# Patient Record
Sex: Male | Born: 1985 | Race: White | Hispanic: No | Marital: Married | Smoking: Former smoker
Health system: Southern US, Community
[De-identification: ages and names within clinical notes are randomized; demographics above are authoritative.]

## PROBLEM LIST (undated history)

## (undated) DIAGNOSIS — R7989 Other specified abnormal findings of blood chemistry: Secondary | ICD-10-CM

## (undated) DIAGNOSIS — T63001A Toxic effect of unspecified snake venom, accidental (unintentional), initial encounter: Secondary | ICD-10-CM

## (undated) DIAGNOSIS — G4730 Sleep apnea, unspecified: Secondary | ICD-10-CM

## (undated) HISTORY — PX: WISDOM TOOTH EXTRACTION: SHX21

## (undated) HISTORY — PX: VASECTOMY: SHX75

---

## 2012-06-16 ENCOUNTER — Other Ambulatory Visit: Payer: Self-pay | Admitting: Occupational Medicine

## 2012-06-16 ENCOUNTER — Ambulatory Visit
Admission: RE | Admit: 2012-06-16 | Discharge: 2012-06-16 | Disposition: A | Discharge status: Home / Self Care | Payer: No Typology Code available for payment source | Source: Ambulatory Visit | Attending: Occupational Medicine | Admitting: Occupational Medicine

## 2012-06-16 DIAGNOSIS — Z021 Encounter for pre-employment examination: Secondary | ICD-10-CM

## 2013-06-22 IMAGING — CR DG CHEST 1V
2 series · 2 of 2 positions shown · non-contrast
Comparison: None.

CLINICAL DATA: Smoking history

CHEST - 1 VIEW

[view not recorded (1 of 2)]
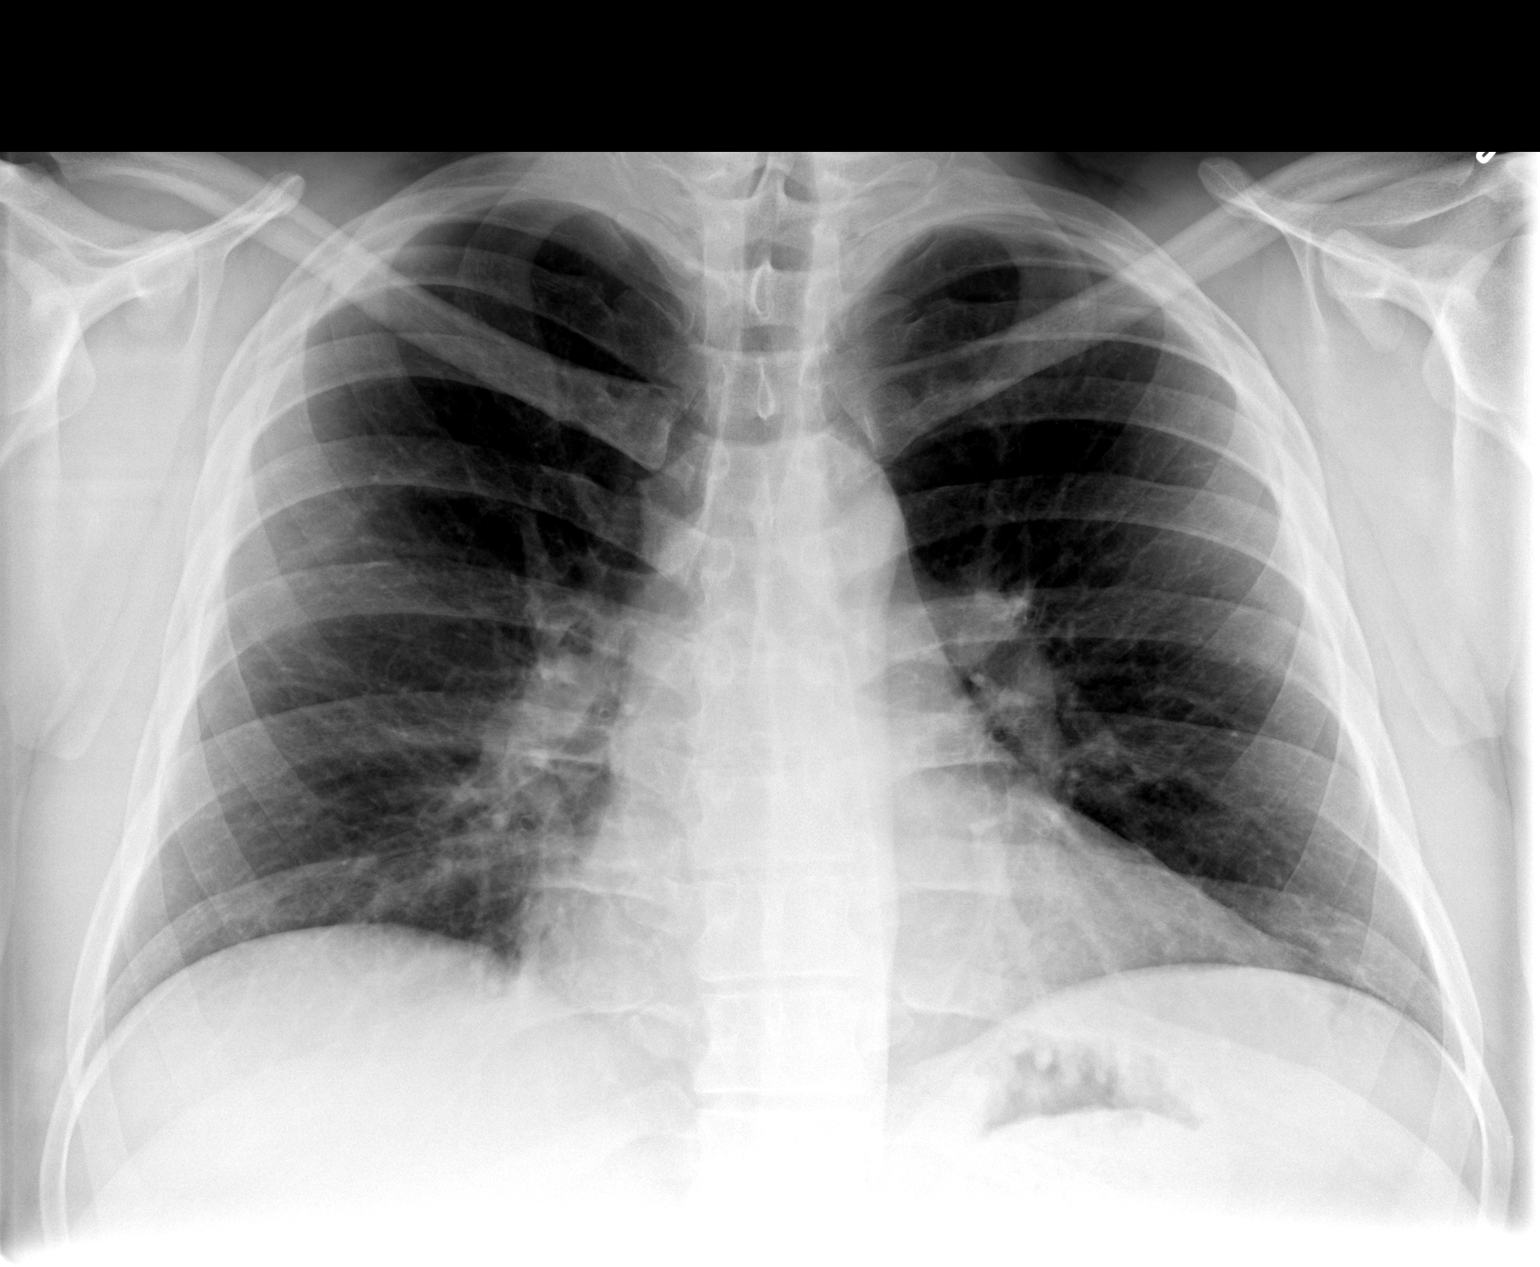

[view not recorded (2 of 2)]
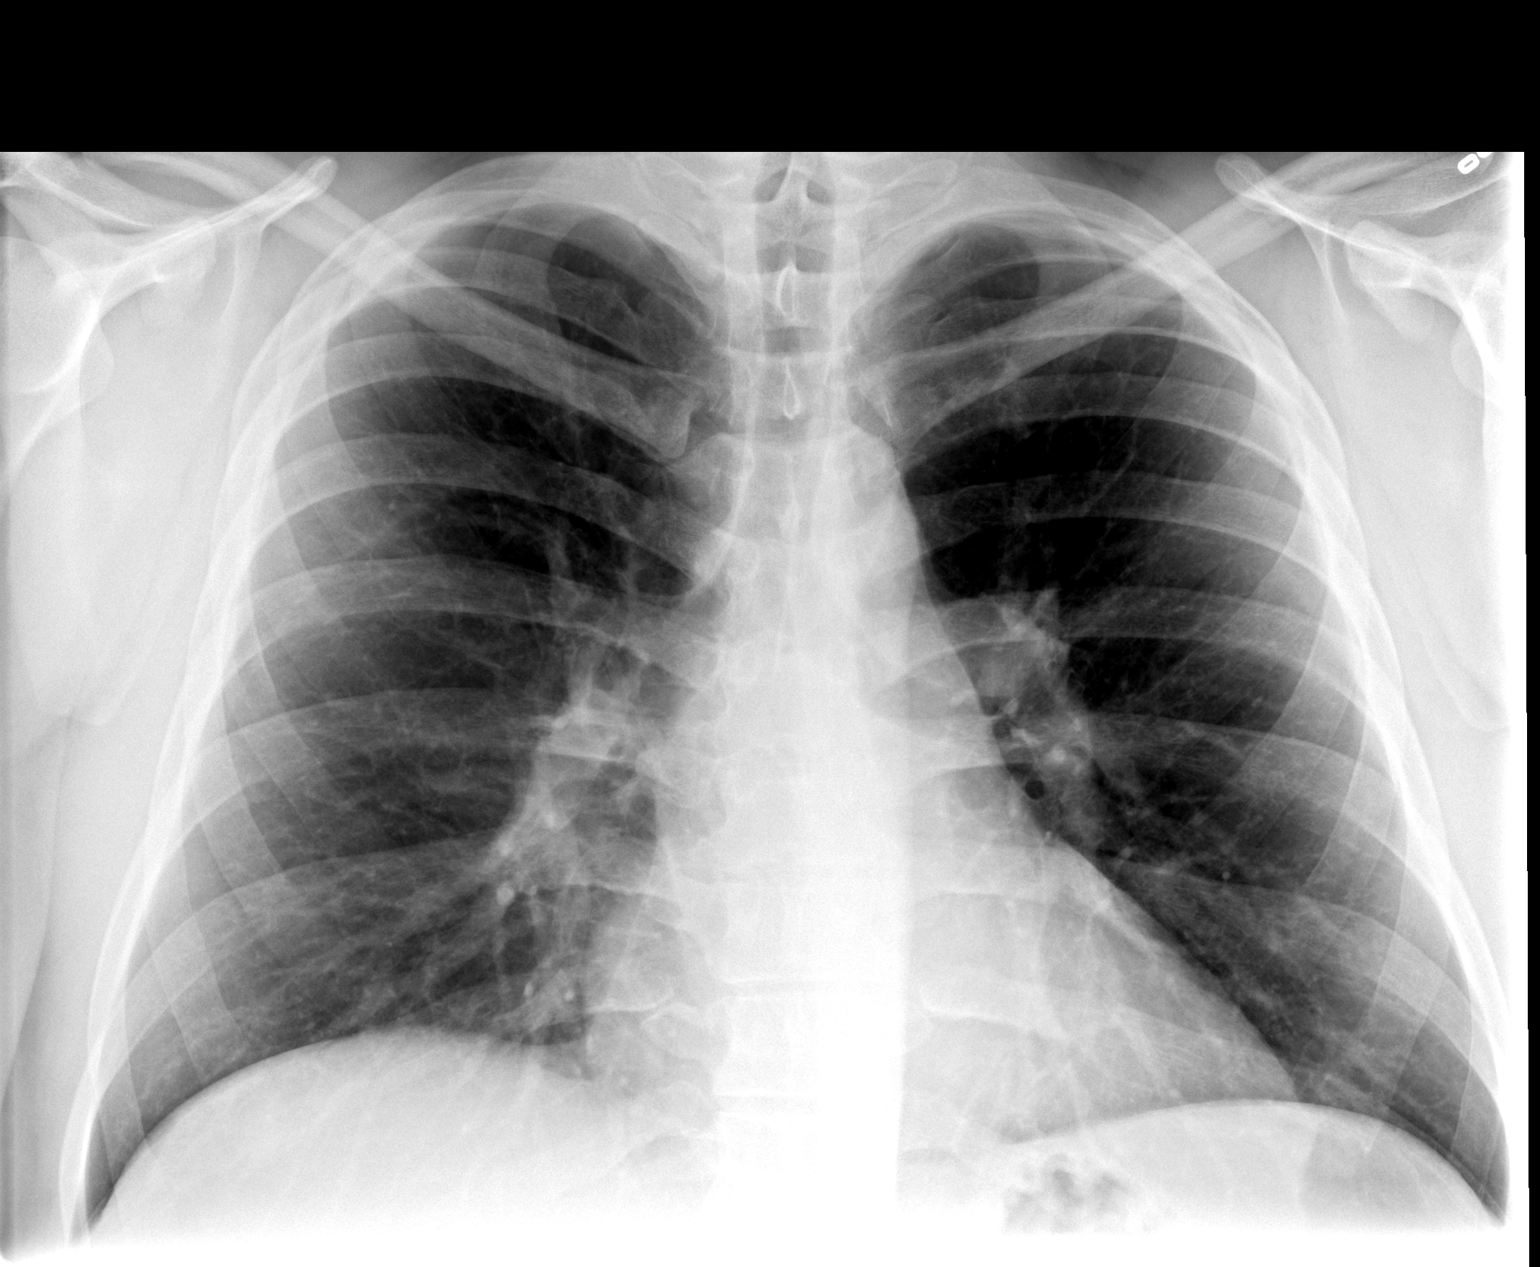

[2 of 2 positions shown; findings below may reference images not displayed]

FINDINGS: Heart size is normal.  Mediastinal shadows are normal.
The lungs are clear.  No effusions.  No bony abnormalities.
IMPRESSION: Normal one-view chest.

## 2016-10-08 ENCOUNTER — Inpatient Hospital Stay (HOSPITAL_COMMUNITY)
Admission: EM | Admit: 2016-10-08 | Discharge: 2016-10-10 | DRG: 918 | Disposition: A | Discharge status: Home / Self Care | Payer: 59 | Attending: Internal Medicine | Admitting: Internal Medicine

## 2016-10-08 ENCOUNTER — Encounter (HOSPITAL_COMMUNITY): Payer: Self-pay | Admitting: *Deleted

## 2016-10-08 DIAGNOSIS — T63001A Toxic effect of unspecified snake venom, accidental (unintentional), initial encounter: Secondary | ICD-10-CM | POA: Diagnosis not present

## 2016-10-08 DIAGNOSIS — Z91013 Allergy to seafood: Secondary | ICD-10-CM

## 2016-10-08 DIAGNOSIS — M25571 Pain in right ankle and joints of right foot: Secondary | ICD-10-CM | POA: Diagnosis not present

## 2016-10-08 DIAGNOSIS — X58XXXA Exposure to other specified factors, initial encounter: Secondary | ICD-10-CM | POA: Diagnosis present

## 2016-10-08 DIAGNOSIS — R000 Tachycardia, unspecified: Secondary | ICD-10-CM | POA: Diagnosis not present

## 2016-10-08 DIAGNOSIS — K269 Duodenal ulcer, unspecified as acute or chronic, without hemorrhage or perforation: Secondary | ICD-10-CM

## 2016-10-08 HISTORY — DX: Toxic effect of unspecified snake venom, accidental (unintentional), initial encounter: T63.001A

## 2016-10-08 HISTORY — DX: Sleep apnea, unspecified: G47.30

## 2016-10-08 HISTORY — DX: Other specified abnormal findings of blood chemistry: R79.89

## 2016-10-08 LAB — CBC WITH DIFFERENTIAL/PLATELET
Basophils Absolute: 0 10*3/uL (ref 0.0–0.1)
Basophils Relative: 0 %
Eosinophils Absolute: 0.2 10*3/uL (ref 0.0–0.7)
Eosinophils Relative: 2 %
HCT: 49.7 % (ref 39.0–52.0)
Hemoglobin: 16.7 g/dL (ref 13.0–17.0)
Lymphocytes Relative: 41 %
Lymphs Abs: 3.2 10*3/uL (ref 0.7–4.0)
MCH: 27.8 pg (ref 26.0–34.0)
MCHC: 33.6 g/dL (ref 30.0–36.0)
MCV: 82.7 fL (ref 78.0–100.0)
Monocytes Absolute: 0.6 10*3/uL (ref 0.1–1.0)
Monocytes Relative: 8 %
Neutro Abs: 3.8 10*3/uL (ref 1.7–7.7)
Neutrophils Relative %: 49 %
Platelets: 311 10*3/uL (ref 150–400)
RBC: 6.01 MIL/uL — ABNORMAL HIGH (ref 4.22–5.81)
RDW: 13.8 % (ref 11.5–15.5)
WBC: 7.7 10*3/uL (ref 4.0–10.5)

## 2016-10-08 LAB — BASIC METABOLIC PANEL
Anion gap: 12 (ref 5–15)
BUN: 12 mg/dL (ref 6–20)
CO2: 23 mmol/L (ref 22–32)
Calcium: 9.8 mg/dL (ref 8.9–10.3)
Chloride: 104 mmol/L (ref 101–111)
Creatinine, Ser: 0.95 mg/dL (ref 0.61–1.24)
GFR calc Af Amer: >60 mL/min (ref >60)
GFR calc non Af Amer: >60 mL/min (ref >60)
Glucose, Bld: 90 mg/dL (ref 65–99)
Potassium: 3.8 mmol/L (ref 3.5–5.1)
Sodium: 139 mmol/L (ref 135–145)

## 2016-10-08 LAB — CBC
HEMATOCRIT: 46.1 % (ref 39.0–52.0)
HEMATOCRIT: 44.8 % (ref 39.0–52.0)
HEMOGLOBIN: 15.4 g/dL (ref 13.0–17.0)
HEMOGLOBIN: 14.9 g/dL (ref 13.0–17.0)
MCH: 27.6 pg (ref 26.0–34.0)
MCH: 27.5 pg (ref 26.0–34.0)
MCHC: 33.4 g/dL (ref 30.0–36.0)
MCHC: 33.3 g/dL (ref 30.0–36.0)
MCV: 82.8 fL (ref 78.0–100.0)
MCV: 82.7 fL (ref 78.0–100.0)
Platelets: 272 10*3/uL (ref 150–400)
Platelets: 286 10*3/uL (ref 150–400)
RBC: 5.57 MIL/uL (ref 4.22–5.81)
RBC: 5.42 MIL/uL (ref 4.22–5.81)
RDW: 13.7 % (ref 11.5–15.5)
RDW: 13.8 % (ref 11.5–15.5)
WBC: 9.2 10*3/uL (ref 4.0–10.5)
WBC: 8.7 10*3/uL (ref 4.0–10.5)

## 2016-10-08 LAB — PROTIME-INR
INR: 1.04
INR: 1.08
INR: 1.11
Prothrombin Time: 13.6 seconds (ref 11.4–15.2)
Prothrombin Time: 14.1 seconds (ref 11.4–15.2)
Prothrombin Time: 14.4 seconds (ref 11.4–15.2)

## 2016-10-08 LAB — FIBRINOGEN
Fibrinogen: 378 mg/dL (ref 210–475)
Fibrinogen: 300 mg/dL (ref 210–475)
Fibrinogen: 285 mg/dL (ref 210–475)

## 2016-10-08 LAB — CK
CK TOTAL: 84 U/L (ref 49–397)
CK TOTAL: 79 U/L (ref 49–397)

## 2016-10-08 MED ORDER — HYDROCODONE-ACETAMINOPHEN 5-325 MG PO TABS
1.0000 | ORAL_TABLET | ORAL | Status: DC | PRN
  Administered 2016-10-08 (×2): 2 via ORAL
  Administered 2016-10-09 (×2): 1 via ORAL
  Administered 2016-10-09: 2 via ORAL
  Filled 2016-10-08 (×4): qty 2
  Filled 2016-10-08: qty 1

## 2016-10-08 MED ORDER — SODIUM CHLORIDE 0.9 % IV SOLN
INTRAVENOUS | Status: DC
  Administered 2016-10-09: 19:00:00 via INTRAVENOUS
  Administered 2016-10-09: 1000 mL via INTRAVENOUS
  Administered 2016-10-10: 03:00:00 via INTRAVENOUS

## 2016-10-08 MED ORDER — ONDANSETRON HCL 4 MG PO TABS: 4.0000 mg | ORAL_TABLET | Freq: Four times a day (QID) | ORAL | Status: DC | PRN

## 2016-10-08 MED ORDER — CROTALIDAE POLYVAL IMMUNE FAB IV SOLR
1.0000 | Freq: Once | INTRAVENOUS | Status: AC
  Administered 2016-10-08: 18 mL via INTRAVENOUS
  Filled 2016-10-08: qty 18

## 2016-10-08 MED ORDER — MORPHINE SULFATE (PF) 2 MG/ML IV SOLN
2.0000 mg | INTRAVENOUS | Status: DC | PRN
  Administered 2016-10-08 – 2016-10-09 (×2): 2 mg via INTRAVENOUS
  Filled 2016-10-08 (×2): qty 1

## 2016-10-08 MED ORDER — ACETAMINOPHEN 325 MG PO TABS
650.0000 mg | ORAL_TABLET | Freq: Four times a day (QID) | ORAL | Status: DC | PRN
  Administered 2016-10-08 – 2016-10-10 (×2): 650 mg via ORAL
  Filled 2016-10-08 (×2): qty 2

## 2016-10-08 MED ORDER — SODIUM CHLORIDE 0.9 % IV SOLN
3.0000 | Freq: Once | INTRAVENOUS | Status: AC
  Administered 2016-10-08: 54 mL via INTRAVENOUS
  Filled 2016-10-08: qty 54

## 2016-10-08 MED ORDER — SODIUM CHLORIDE 0.9 % IV BOLUS (SEPSIS)
1000.0000 mL | Freq: Once | INTRAVENOUS | Status: AC
  Administered 2016-10-08: 1000 mL via INTRAVENOUS

## 2016-10-08 MED ORDER — SODIUM CHLORIDE 0.9% FLUSH
3.0000 mL | Freq: Two times a day (BID) | INTRAVENOUS | Status: DC
  Administered 2016-10-08 – 2016-10-09 (×3): 3 mL via INTRAVENOUS

## 2016-10-08 MED ORDER — SODIUM CHLORIDE 0.9 % IV SOLN
4.0000 | Freq: Once | INTRAVENOUS | Status: DC
  Filled 2016-10-08: qty 72

## 2016-10-08 MED ORDER — ACETAMINOPHEN 650 MG RE SUPP: 650.0000 mg | Freq: Four times a day (QID) | RECTAL | Status: DC | PRN

## 2016-10-08 MED ORDER — MORPHINE SULFATE (PF) 4 MG/ML IV SOLN
4.0000 mg | Freq: Once | INTRAVENOUS | Status: AC
  Administered 2016-10-08: 4 mg via INTRAVENOUS
  Filled 2016-10-08: qty 1

## 2016-10-08 MED ORDER — ENOXAPARIN SODIUM 40 MG/0.4ML ~~LOC~~ SOLN
40.0000 mg | SUBCUTANEOUS | Status: DC
  Filled 2016-10-08: qty 0.4

## 2016-10-08 MED ORDER — HYDROMORPHONE HCL 1 MG/ML IJ SOLN
1.0000 mg | Freq: Once | INTRAMUSCULAR | Status: AC
  Administered 2016-10-08: 1 mg via INTRAVENOUS
  Filled 2016-10-08: qty 1

## 2016-10-08 MED ORDER — ONDANSETRON HCL 4 MG/2ML IJ SOLN: 4.0000 mg | Freq: Four times a day (QID) | INTRAMUSCULAR | Status: DC | PRN

## 2016-10-08 NOTE — ED Triage Notes (Signed)
Pt is here with snake bite (copperhead) right inner ankle.

## 2016-10-08 NOTE — H&P (Signed)
History and Physical    Brent Arroyo:[id number] DOB: 1986-04-24 DOA: 10/08/2016  [facility]: [facility] Patient coming from: home  Chief Complaint: snake bite  HPI: Brent Arroyo is a 30 y.o. male with medical history significant low testosterone presents to the emergency department chief complaint of snake bite.  Information is obtained from the patient. He reports he was cleaning out his shed he felt pain in his right ankle that he equated to "felt yellow jacket sting". He looked down and saw 2 small dark spots with blood coming out of one of them. He looked around the shed and saw snake. He reports killing the snake and believes it was a copperhead. He came straight to the hospital. He reports increasing pain to his ankle as well as swelling and heat. Pain is constant describes it as an ache and radiates up his leg. Associated symptoms include some numbness at the wound site. He denies headache visual changes chest pain palpitation shortness of breath. He denies any abdominal pain nausea vomiting. He denies any fever chills.    ED Course: In emergency department he's afebrile hemodynamically stable but a little tachycardic and not hypoxic. He is provided with IV fluids.  Review of Systems: As per HPI otherwise 10 point review of systems negative.   Ambulatory Status: Ambulates independently at the steady gait  Past Medical History:  Diagnosis Date  . Low testosterone in male   . Snake bite poisoning     History reviewed. No pertinent surgical history.   Social History   Social History  . Marital status: Married    Spouse name: N/A  . Number of children: N/A  . Years of education: N/A   Occupational History  . Not on file.   Social History Main Topics  . Smoking status: Former Games developer  . Smokeless tobacco: Never Used  . Alcohol use Yes     Comment: occ  . Drug use: No  . Sexual activity: Not on file   Other Topics Concern  . Not on file    Social History Narrative  . No narrative on file   Patient lives at home with his wife. 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 Shellfish Allergy Diarrhea and Nausea And Vomiting    Family History  Problem Relation Age of Onset  . Breast cancer Mother   . CAD Father    Other is alive with breast cancer  Prior to Admission medications   Medication Sig Start Date End Date Taking? Authorizing Provider  ANDROGEL PUMP 20.25 MG/ACT (1.62%) GEL Apply 2 Squirts topically daily. 09/11/16  Yes Historical Provider, MD    Physical Exam: Vitals:   10/08/16 1415 10/08/16 1430 10/08/16 1445 10/08/16 1500  BP: 128/82 136/91 128/80 134/88  Pulse: 81 85 79 80  Resp: 16 13 26 21   Temp:      TempSrc:      SpO2: 97% 97% 95% 96%  Weight:      Height:         General:  Appears calm and comfortable, not flushed Eyes:  PERRL, EOMI, normal lids, iris ENT:  grossly normal hearing, lips & tongue, his membranes of his mouth are pink slightly dry Neck:  no LAD, masses or thyromegaly Cardiovascular:  RRR, no m/r/g. Mild edema on right ankle Respiratory:  CTA bilaterally, no w/r/r. Normal respiratory effort. Abdomen:  soft, ntnd, obese positive bowel sounds no guarding or rebounding  Skin:  no rash or induration seen on limited exam 2 punctate marks medial aspect right ankle. Very little erythema some heat tenderness Musculoskeletal:  grossly normal tone BUE/BLE, good ROM, no bony abnormality Psychiatric:  grossly normal mood and affect, speech fluent and appropriate, AOx3 Neurologic:  CN 2-12 grossly intact, moves all extremities in coordinated fashion, sensation intact  Labs on Admission: I have personally reviewed following labs and imaging studies  CBC:  Recent Labs Lab 10/08/16 1210  WBC 7.7  NEUTROABS 3.8  HGB 16.7  HCT 49.7  MCV 82.7  PLT 311   Basic Metabolic Panel:  Recent Labs Lab 10/08/16 1210  NA 139  K 3.8  CL  104  CO2 23  GLUCOSE 90  BUN 12  CREATININE 0.95  CALCIUM 9.8   GFR: Estimated Creatinine Clearance: 173.5 mL/min (by C-G formula based on SCr of 0.95 mg/dL). Liver Function Tests: No results for input(s): AST, ALT, ALKPHOS, BILITOT, PROT, ALBUMIN in the last 168 hours. No results for input(s): LIPASE, AMYLASE in the last 168 hours. No results for input(s): AMMONIA in the last 168 hours. Coagulation Profile:  Recent Labs Lab 10/08/16 1210  INR 1.04   Cardiac Enzymes: No results for input(s): CKTOTAL, CKMB, CKMBINDEX, TROPONINI in the last 168 hours. BNP (last 3 results) No results for input(s): PROBNP in the last 8760 hours. HbA1C: No results for input(s): HGBA1C in the last 72 hours. CBG: No results for input(s): GLUCAP in the last 168 hours. Lipid Profile: No results for input(s): CHOL, HDL, LDLCALC, TRIG, CHOLHDL, LDLDIRECT in the last 72 hours. Thyroid Function Tests: No results for input(s): TSH, T4TOTAL, FREET4, T3FREE, THYROIDAB in the last 72 hours. Anemia Panel: No results for input(s): VITAMINB12, FOLATE, FERRITIN, TIBC, IRON, RETICCTPCT in the last 72 hours. Urine analysis: No results found for: COLORURINE, APPEARANCEUR, LABSPEC, PHURINE, GLUCOSEU, HGBUR, BILIRUBINUR, KETONESUR, PROTEINUR, UROBILINOGEN, NITRITE, LEUKOCYTESUR  Creatinine Clearance: Estimated Creatinine Clearance: 173.5 mL/min (by C-G formula based on SCr of 0.95 mg/dL).  Sepsis Labs: @LABRCNTIP (procalcitonin:4,lacticidven:4) )No results found for this or any previous visit (from the past 240 hour(s)).   Radiological Exams on Admission: No results found.  EKG:   Assessment/Plan Principal Problem:   Snake bite poisoning   1. Snake bite poisoning. Patient with 2 punctate marks on his right ankle. He reports copperhead. Initial CBC, INR and fibrinogen within normal limits of normal -Admit to telemetry -CBC INR, CK, fibrinogen every 43 -[facility] -Pain [facility]  #2. Tachycardia. Likely related to above. He is provided with IV fluids. Resolved on admission -gentle IV fluids     DVT prophylaxis: lovenox  Code Status: full  Family Communication: wife and mother at bedside  Disposition Plan: home hopefully in am  Consults called: none  Admission status: obs    Gwenyth BenderBLACK,Yesika Rispoli M MD [facility]  If 7PM-7AM, please contact night-coverage [email or web address] Password [email or web address]  10/08/2016, 3:22 PM

## 2016-10-08 NOTE — ED Notes (Signed)
Pharmacy states they will send 1 vial Crofab to [facility] when ready.

## 2016-10-08 NOTE — ED Provider Notes (Signed)
[facility]-EMERGENCY DEPT Provider Note   CSN: [id number] Arrival date & time: 10/08/16  1149     History   Chief Complaint Chief Complaint  Patient presents with  . Snake Bite    HPI Brent Arroyo is a 30 y.o. male who presents with snake bite to right medial ankle that occurred just prior to arrival. Patient states he was cleaning out a shed at work and a snake came out and that in the ankle. Patient killed the snake and is pretty sure that it is a copperhead. Patient is having increasing pain to his ankle that is radiating up his leg. Patient has localized numbness around the wound. He describes a generalized tightness in his muscles, but denies any difficulty breathing. Patient denies any chest pain, shortness of breath, abdominal pain, nausea, vomiting. He did not take any medications PTA. Tetanus up-to-date.  HPI  Past Medical History:  Diagnosis Date  . Low testosterone in male   . Snake bite poisoning     Patient Active Problem List   Diagnosis Date Noted  . Snake bite poisoning 10/08/2016    History reviewed. No pertinent surgical history.     Home Medications    Prior to Admission medications   Medication Sig Start Date End Date Taking? Authorizing Provider  ANDROGEL PUMP 20.25 MG/ACT (1.62%) GEL Apply 2 Squirts topically daily. 09/11/16  Yes Historical Provider, MD    Family History Family History  Problem Relation Age of Onset  . Breast cancer Mother   . CAD Father     Social History Social History  Substance Use Topics  . Smoking status: Former Games developer  . Smokeless tobacco: Never Used  . Alcohol use Yes     Comment: occ     Allergies   Shellfish allergy   Review of Systems Review of Systems  Constitutional: Negative for chills and fever.  HENT: Negative for facial swelling and sore throat.   Respiratory: Negative for shortness of breath.   Cardiovascular: Negative for chest pain.  Gastrointestinal: Negative for abdominal pain, nausea  and vomiting.  Genitourinary: Negative for dysuria.  Musculoskeletal: Negative for back pain.  Skin: Positive for wound. Negative for rash.  Neurological: Positive for numbness (surrounding wound). Negative for headaches.  Psychiatric/Behavioral: The patient is not nervous/anxious.      Physical Exam Updated Vital Signs BP 134/88   Pulse 80   Temp 98 F (36.7 C) (Oral)   Resp 21   Ht 6\' 4"  (1.93 m)   Wt (!) 142 kg   SpO2 96%   BMI 38.10 kg/m   Physical Exam  Constitutional: He appears well-developed and well-nourished. No distress.  HENT:  Head: Normocephalic and atraumatic.  Mouth/Throat: Oropharynx is clear and moist. No oropharyngeal exudate.  Eyes: Conjunctivae are normal. Pupils are equal, round, and reactive to light. Right eye exhibits no discharge. Left eye exhibits no discharge. No scleral icterus.  Neck: Normal range of motion. Neck supple. No thyromegaly present.  Cardiovascular: Normal rate, regular rhythm, normal heart sounds and intact distal pulses.  Exam reveals no gallop and no friction rub.   No murmur heard. Pulmonary/Chest: Effort normal and breath sounds normal. No stridor. No respiratory distress. He has no wheezes. He has no rales.  Abdominal: Soft. Bowel sounds are normal. He exhibits no distension. There is no tenderness. There is no rebound and no guarding.  Musculoskeletal: He exhibits no edema.       Right lower leg: He exhibits tenderness.  Legs: Lymphadenopathy:    He has no cervical adenopathy.  Neurological: He is alert. Coordination normal.  Normal sensation to bilateral LEs with exception of 5 cm area surrounding wound; numbness to said area  Skin: Skin is warm and dry. No rash noted. He is not diaphoretic. No pallor.  2 punctate marks with dried blood over medial malleous of R ankle; minimal swelling, no erythema; see image  Psychiatric: He has a normal mood and affect.  Nursing note and vitals reviewed.      ED Treatments /  Results  Labs (all labs ordered are listed, but only abnormal results are displayed) Labs Reviewed  CBC WITH DIFFERENTIAL/PLATELET - Abnormal; Notable for the following:       Result Value   RBC 6.01 (*)    All other components within normal limits  PROTIME-INR  FIBRINOGEN  BASIC METABOLIC PANEL  CBC  PROTIME-INR  FIBRINOGEN  CBC  PROTIME-INR  FIBRINOGEN    EKG  EKG Interpretation None       Radiology No results found.  Procedures Procedures (including critical care time)  Medications Ordered in ED Medications  sodium chloride 0.9 % bolus 1,000 mL (1,000 mLs Intravenous New Bag/Given 10/08/16 1215)    And  0.9 %  sodium chloride infusion ( Intravenous Hold 10/08/16 1216)  crotalidae polyvalent immune fab (CROFAB) 3 vial in sodium chloride 0.9 % 304 mL infusion (54 mLs Intravenous Given 10/08/16 1443)  crotalidae polyvalent immune fab (CROFAB) 1 vial in sodium chloride 0.9 % 268 mL infusion (not administered)  enoxaparin (LOVENOX) injection 40 mg (not administered)  sodium chloride flush (NS) 0.9 % injection 3 mL (not administered)  acetaminophen (TYLENOL) tablet 650 mg (not administered)    Or  acetaminophen (TYLENOL) suppository 650 mg (not administered)  HYDROcodone-acetaminophen (NORCO/VICODIN) 5-325 MG per tablet 1-2 tablet (2 tablets Oral Given 10/08/16 1513)  ondansetron (ZOFRAN) tablet 4 mg (not administered)    Or  ondansetron (ZOFRAN) injection 4 mg (not administered)  morphine 4 MG/ML injection 4 mg (4 mg Intravenous Given 10/08/16 1239)  HYDROmorphone (DILAUDID) injection 1 mg (1 mg Intravenous Given 10/08/16 1345)     Initial Impression / Assessment and Plan / ED Course  I have reviewed the triage vital signs and the nursing notes.  Pertinent labs & imaging results that were available during my care of the patient were reviewed by me and considered in my medical decision making (see chart for details).  Clinical Course    Patient meeting CroFab  criteria due to spreading point in pain as well as tachycardia at onset. Initial labs unremarkable, however patient to be admitted for repeat labs every 4 hours for 12-18 hours as recommended by Hospital algorithm for snake bite. I spoke with Brent Arroyo with [facility] who is that the patient to observation. Patient also evaluated by Dr. Denton LankSteinl who guided the patient's management and agrees with plan.  Final Clinical Impressions(s) / ED Diagnoses   Final diagnoses:  Toxic effect of snake venom, unintentional, initial encounter    New Prescriptions New Prescriptions   No medications on file     Brent Holeslexandra M Summerlynn Glauser, PA-C 10/08/16 1526    Cathren LaineKevin Steinl, MD 10/08/16 1531

## 2016-10-09 ENCOUNTER — Encounter (HOSPITAL_COMMUNITY): Payer: Self-pay | Admitting: General Practice

## 2016-10-09 DIAGNOSIS — Z91013 Allergy to seafood: Secondary | ICD-10-CM | POA: Diagnosis not present

## 2016-10-09 DIAGNOSIS — X58XXXA Exposure to other specified factors, initial encounter: Secondary | ICD-10-CM | POA: Diagnosis present

## 2016-10-09 DIAGNOSIS — M25571 Pain in right ankle and joints of right foot: Secondary | ICD-10-CM | POA: Diagnosis present

## 2016-10-09 DIAGNOSIS — T63001A Toxic effect of unspecified snake venom, accidental (unintentional), initial encounter: Secondary | ICD-10-CM | POA: Diagnosis not present

## 2016-10-09 DIAGNOSIS — R000 Tachycardia, unspecified: Secondary | ICD-10-CM | POA: Diagnosis not present

## 2016-10-09 LAB — BASIC METABOLIC PANEL
Anion gap: 5 (ref 5–15)
BUN: 12 mg/dL (ref 6–20)
CO2: 24 mmol/L (ref 22–32)
Calcium: 8.7 mg/dL — ABNORMAL LOW (ref 8.9–10.3)
Chloride: 109 mmol/L (ref 101–111)
Creatinine, Ser: 0.89 mg/dL (ref 0.61–1.24)
GFR calc Af Amer: >60 mL/min (ref >60)
GLUCOSE: 110 mg/dL — AB (ref 65–99)
POTASSIUM: 4 mmol/L (ref 3.5–5.1)
Sodium: 138 mmol/L (ref 135–145)

## 2016-10-09 LAB — CBC
HEMATOCRIT: 44.8 % (ref 39.0–52.0)
Hemoglobin: 14.8 g/dL (ref 13.0–17.0)
MCH: 27.5 pg (ref 26.0–34.0)
MCHC: 33 g/dL (ref 30.0–36.0)
MCV: 83.3 fL (ref 78.0–100.0)
Platelets: 271 10*3/uL (ref 150–400)
RBC: 5.38 MIL/uL (ref 4.22–5.81)
RDW: 13.9 % (ref 11.5–15.5)
WBC: 7.8 10*3/uL (ref 4.0–10.5)

## 2016-10-09 LAB — PROTIME-INR
INR: 1.1
Prothrombin Time: 14.2 seconds (ref 11.4–15.2)

## 2016-10-09 MED ORDER — SODIUM CHLORIDE 0.9 % IV SOLN
2.0000 | Freq: Once | INTRAVENOUS | Status: AC
  Administered 2016-10-09: 36 mL via INTRAVENOUS
  Filled 2016-10-09: qty 36

## 2016-10-09 MED ORDER — MORPHINE SULFATE (PF) 2 MG/ML IV SOLN
2.0000 mg | INTRAVENOUS | Status: DC | PRN
  Administered 2016-10-09 (×3): 2 mg via INTRAVENOUS
  Filled 2016-10-09 (×3): qty 1

## 2016-10-09 NOTE — Progress Notes (Signed)
PROGRESS NOTE    Brent Arroyo  ZOX:[id number] DOB: 08/03/1986 DOA: 10/08/2016 [facility]     Brief Narrative:  Brent Arroyo is a 30 y.o. male with medical history significant low testosterone presents to the emergency department chief complaint of snake bite. He reports he was cleaning out his shed he felt pain in his right ankle that he equated to "felt yellow jacket sting". He looked down and saw 2 small dark spots with blood coming out of one of them. He looked around the shed and saw snake. He reports killing the snake and believes it was a copperhead. He came straight to the hospital. He reports increasing pain to his ankle as well as swelling and heat. Pain is constant describes it as an ache and radiates up his leg. Associated symptoms include some numbness at the wound site. He denies headache visual changes chest pain palpitation shortness of breath. He denies any abdominal pain nausea vomiting. He denies any fever chills.   Assessment & Plan:   Principal Problem:   Snake bite poisoning Active Problems:   Tachycardia    Snake bite poisoning -Patient with 2 punctate marks on his right ankle. He reports copperhead snake.  -CroFab started in ED per protocol (received total 4 vials so far) -Labs stable -Called and spoke with Poison Control this morning. Discussed his progression of symptoms. Will administer 2 more vials today. Continue to elevate leg above level of heart. Ambulate only as tolerated. Opioid pain control, goal is to control pain with oral agents prior to discharge. Observe 1 more day. They will follow up tonight.   Tachycardia -Resolved    DVT prophylaxis: lovenox Code Status: full Family Communication: wife at bedside Disposition Plan: pending further improvement. Home when stable.    Consultants:   None  Procedures:   None  Antimicrobials:   None     Subjective: Patient complains of worsening swelling today.  Swelling has now progressed up to his calf and down to his foot. His pain is improved when laying in bed, but can hardly put any pressure on his foot to ambulate. No fever, chills, no bleeding at the site, no sensation loss, no chest pain or SOB, no N/V/D.   Objective: Vitals:   10/08/16 1710 10/08/16 2009 10/09/16 0454 10/09/16 0954  BP: 121/67 132/73 118/66 105/72  Pulse: 77 82 68 90  Resp: 18 20 20 19   Temp: 97.6 F (36.4 C) 97.7 F (36.5 C) 97.7 F (36.5 C) 97.8 F (36.6 C)  TempSrc: Oral   Oral  SpO2: 98% 98% 97% 94%  Weight: (!) 142 kg (313 lb) (!) 147.4 kg (325 lb)    Height: 6\' 4"  (1.93 m)       Intake/Output Summary (Last 24 hours) at 10/09/16 1009 Last data filed at 10/09/16 0455  Gross per 24 hour  Intake              378 ml  Output             1100 ml  Net             -722 ml   Filed Weights   10/08/16 1159 10/08/16 1710 10/08/16 2009  Weight: (!) 142 kg (313 lb) (!) 142 kg (313 lb) (!) 147.4 kg (325 lb)    Examination:  General exam: Appears calm and comfortable  Respiratory system: Clear to auscultation. Respiratory effort normal. Cardiovascular system: S1 & S2 heard, RRR. No JVD, murmurs, rubs,  gallops or clicks.  Gastrointestinal system: Abdomen is nondistended, soft and nontender. No organomegaly or masses felt. Normal bowel sounds heard. Central nervous system: Alert and oriented. No focal neurological deficits. Extremities: Symmetric 5 x 5 power. Skin: +right medial ankle with swelling, no bleeding at puncture site, +2 edema right lower extremity, +2 pedal pulses  Psychiatry: Judgement and insight appear normal. Mood & affect appropriate.   Data Reviewed: I have personally reviewed following labs and imaging studies  CBC:  Recent Labs Lab 10/08/16 1210 10/08/16 1802 10/08/16 2150 10/09/16 0425  WBC 7.7 9.2 8.7 7.8  NEUTROABS 3.8  --   --   --   HGB 16.7 15.4 14.9 14.8  HCT 49.7 46.1 44.8 44.8  MCV 82.7 82.8 82.7 83.3  PLT 311 272 286 271    Basic Metabolic Panel:  Recent Labs Lab 10/08/16 1210 10/09/16 0425  NA 139 138  K 3.8 4.0  CL 104 109  CO2 23 24  GLUCOSE 90 110*  BUN 12 12  CREATININE 0.95 0.89  CALCIUM 9.8 8.7*   GFR: Estimated Creatinine Clearance: 188.8 mL/min (by C-G formula based on SCr of 0.89 mg/dL). Liver Function Tests: No results for input(s): AST, ALT, ALKPHOS, BILITOT, PROT, ALBUMIN in the last 168 hours. No results for input(s): LIPASE, AMYLASE in the last 168 hours. No results for input(s): AMMONIA in the last 168 hours. Coagulation Profile:  Recent Labs Lab 10/08/16 1210 10/08/16 1802 10/08/16 2150 10/09/16 0425  INR 1.04 1.08 1.11 1.10   Cardiac Enzymes:  Recent Labs Lab 10/08/16 1802 10/08/16 2150  CKTOTAL 84 79   BNP (last 3 results) No results for input(s): PROBNP in the last 8760 hours. HbA1C: No results for input(s): HGBA1C in the last 72 hours. CBG: No results for input(s): GLUCAP in the last 168 hours. Lipid Profile: No results for input(s): CHOL, HDL, LDLCALC, TRIG, CHOLHDL, LDLDIRECT in the last 72 hours. Thyroid Function Tests: No results for input(s): TSH, T4TOTAL, FREET4, T3FREE, THYROIDAB in the last 72 hours. Anemia Panel: No results for input(s): VITAMINB12, FOLATE, FERRITIN, TIBC, IRON, RETICCTPCT in the last 72 hours. Sepsis Labs: No results for input(s): PROCALCITON, LATICACIDVEN in the last 168 hours.  No results found for this or any previous visit (from the past 240 hour(s)).     Radiology Studies: No results found.    Scheduled Meds: . crotalidae immune fab (CROFAB) infusion  2 vial Intravenous Once  . enoxaparin (LOVENOX) injection  40 mg Subcutaneous Q24H  . sodium chloride flush  3 mL Intravenous Q12H   Continuous Infusions: . sodium chloride 1,000 mL (10/09/16 0454)     LOS: 0 days    Time spent: 40 minutes   Noralee Stain, DO [facility] [email or web address] Password [email or web address] 10/09/2016, 10:09 AM

## 2016-10-09 NOTE — Care Management Note (Signed)
Case Management Note  Patient Details  Name: Brent Arroyo MRN: [id number] Date of Birth: 1986/01/02  Subjective/Objective:       CM following for progression and d/c planning.             Action/Plan: 10/09/2016 Met with pt and wife , per wife request re cost of hospitalization and re if pt insurance would cover pt treatment with antivenom. She states that she has called her insurance provider and was told that she needed a code. This CM explained to the pt and his wife that final coding is not done until the time of d/c, additional we will provide the standard of practice and care for this pt as recommended by the [place] without seeking insurance approval prior to treatment as this is an emergency situation. Also recommended to Mrs Streight that she contact the finance dept as soon as the pt receives a statement re any questions she may have.   Expected Discharge Date:                  Expected Discharge Plan:  Home/Self Care  In-House Referral:  NA  Discharge planning Services  CM Consult  Post Acute Care Choice:  NA Choice offered to:  NA  DME Arranged:  N/A DME Agency:  NA  HH Arranged:  NA HH Agency:  NA  Status of Service:  Completed, signed off  If discussed at [place] of Stay Meetings, dates discussed:    Additional Comments:  Adron Bene, RN 10/09/2016, 11:34 AM

## 2016-10-10 DIAGNOSIS — R000 Tachycardia, unspecified: Secondary | ICD-10-CM

## 2016-10-10 DIAGNOSIS — T63001A Toxic effect of unspecified snake venom, accidental (unintentional), initial encounter: Principal | ICD-10-CM

## 2016-10-10 LAB — CBC
HEMATOCRIT: 47.6 % (ref 39.0–52.0)
Hemoglobin: 15.7 g/dL (ref 13.0–17.0)
MCH: 27.4 pg (ref 26.0–34.0)
MCHC: 33 g/dL (ref 30.0–36.0)
MCV: 83.2 fL (ref 78.0–100.0)
Platelets: 265 10*3/uL (ref 150–400)
RBC: 5.72 MIL/uL (ref 4.22–5.81)
RDW: 13.7 % (ref 11.5–15.5)
WBC: 8 10*3/uL (ref 4.0–10.5)

## 2016-10-10 LAB — PROTIME-INR
INR: 1
Prothrombin Time: 13.2 seconds (ref 11.4–15.2)

## 2016-10-10 LAB — FIBRINOGEN: Fibrinogen: 414 mg/dL (ref 210–475)

## 2016-10-10 MED ORDER — HYDROCODONE-ACETAMINOPHEN 5-325 MG PO TABS: 1.0000 | ORAL_TABLET | Freq: Four times a day (QID) | ORAL | 0 refills | Status: AC | PRN

## 2016-10-10 NOTE — Progress Notes (Signed)
D/c instructions reviewed with pt and his wife. Copy of instructions and script given to pt. Poison control called unit for updated labs and faxed over an education sheet on snake bites for pt.  Pt also has education sheet from hospital in d/c packet, reviewed with pt.  Pt d/c'd via wheelchair with belongings, with wife, (wife brought a wheelchair from home, she wanted to wheel pt out herself.

## 2016-10-10 NOTE — Progress Notes (Signed)
Right leg much better Very minimal swelling around his ankle area, slight discoloration which is now clearing (per patient) He is able to stand and bear weight now Spoke with poison control-recommendations are to repeat coags/fibrinogen-and d/c home later today

## 2016-10-10 NOTE — Discharge Summary (Signed)
PATIENT DETAILS Name: Brent Arroyo Age: 30 y.o. Sex: male Date of Birth: Mar 09, 1986 MRN: [id number]. Admitting Physician: Ozella Rocks, MD [id number]  Admit Date: 10/08/2016 Discharge date: 10/10/2016  Recommendations for Outpatient Follow-up:  1. Follow up with PCP in 1-2 weeks 2. Please obtain BMP/CBC in one week  Admitted From:  Home  Disposition: Home   Home Health: No  Equipment/Devices: None  Discharge Condition: Stable  CODE STATUS: FULL CODE  Diet recommendation:   Regular  Brief Summary: See H&P, Labs, Consult and Test reports for all details in brief, patient is a 30 year old male who presented to the ED on 10/16, following a snake bite to his right lower extremity.  Brief Hospital Course: Snake bite poisoning: To his right lower extremity-patient works as a landscaper-was admitted and given a total of 6 vials of CroFab. He is significantly improved, he has very minimal swelling around his right ankle. He is now able to stand and bear weight. Spoke to poison control, no further recommendations-okay to discharge. Have asked patient to continue elevating his right leg, continue using as needed narcotics for pain-only after trying nonsteroidal anti-inflammatory medications. He needs to follow-up with his PCP within one week.  Tachycardia: Resolved  Procedures/Studies: None  Discharge Diagnoses:  Principal Problem:   Snake bite poisoning Active Problems:   Tachycardia  Discharge Instructions:  Activity:  As tolerated with Full fall precautions use walker/cane & assistance as needed   Discharge Instructions    Call MD for:  redness, tenderness, or signs of infection (pain, swelling, redness, odor or green/yellow discharge around incision site)    Complete by:  As directed    Call MD for:  severe uncontrolled pain    Complete by:  As directed    Diet general    Complete by:  As directed    Discharge instructions     Complete by:  As directed    Follow with Primary MD  [facility]  In 1 week  Keep your right leg elevated, keep yourelf well hydrated. If you have worsening swelling, discoloration of the right lower extremity, please seek immediate medical attention  Please get a complete blood count and chemistry panel checked by your Primary MD at your next visit, and again as instructed by your Primary MD.  Get Medicines reviewed and adjusted: Please take all your medications with you for your next visit with your Primary MD  Laboratory/radiological data: Please request your Primary MD to go over all hospital tests and procedure/radiological results at the follow up, please ask your Primary MD to get all Hospital records sent to his/her office.  In some cases, they will be blood work, cultures and biopsy results pending at the time of your discharge. Please request that your primary care M.D. follows up on these results.  Also Note the following: If you experience worsening of your admission symptoms, develop shortness of breath, life threatening emergency, suicidal or homicidal thoughts you must seek medical attention immediately by calling 911 or calling your MD immediately  if symptoms less severe.  You must read complete instructions/literature along with all the possible adverse reactions/side effects for all the Medicines you take and that have been prescribed to you. Take any new Medicines after you have completely understood and accpet all the possible adverse reactions/side effects.   Do not drive when taking Pain medications or sleeping medications (Benzodaizepines)  Do not take more than prescribed Pain, Sleep and Anxiety Medications. It is  not advisable to combine anxiety,sleep and pain medications without talking with your primary care practitioner  Special Instructions: If you have smoked or chewed Tobacco  in the last 2 yrs please stop smoking, stop any regular Alcohol  and  or any Recreational drug use.  Wear Seat belts while driving.  Please note: You were cared for by a hospitalist during your hospital stay. Once you are discharged, your primary care physician will handle any further medical issues. Please note that NO REFILLS for any discharge medications will be authorized once you are discharged, as it is imperative that you return to your primary care physician (or establish a relationship with a primary care physician if you do not have one) for your post hospital discharge needs so that they can reassess your need for medications and monitor your lab values.   Increase activity slowly    Complete by:  As directed        Medication List    TAKE these medications   ANDROGEL PUMP 20.25 MG/ACT (1.62%) Gel Generic drug:  Testosterone Apply 2 Squirts topically daily.   HYDROcodone-acetaminophen 5-325 MG tablet Commonly known as:  NORCO/VICODIN Take 1 tablet by mouth every 6 (six) hours as needed for moderate pain.      Follow-up Information    [facility]. Schedule an appointment as soon as possible for a visit in 1 week(s).   Specialty:  Endocrinology         Allergies  Allergen Reactions  . Shellfish Allergy Diarrhea and Nausea And Vomiting    Consultations:  [employer] poison control   Other Procedures/Studies:  No results found.   TODAY-DAY OF DISCHARGE:  Subjective:   Brent Arroyo today has no headache,no chest abdominal pain,no new weakness tingling or numbness, feels much better wants to go home today.   Objective:   Blood pressure 127/84, pulse 90, temperature 97.3 F (36.3 C), temperature source Oral, resp. rate 19, height 6\' 4"  (1.93 m), weight (!) 142.5 kg (314 lb 2.5 oz), SpO2 98 %.  Intake/Output Summary (Last 24 hours) at 10/10/16 1021 Last data filed at 10/10/16 0600  Gross per 24 hour  Intake             2980 ml  Output             2725 ml  Net              255 ml   Filed Weights     10/08/16 1710 10/08/16 2009 10/09/16 2138  Weight: (!) 142 kg (313 lb) (!) 147.4 kg (325 lb) (!) 142.5 kg (314 lb 2.5 oz)    Exam: Awake Alert, Oriented *3, No new F.N deficits, Normal affect [place].AT,PERRAL Supple Neck,No JVD, No cervical lymphadenopathy appriciated.  Symmetrical Chest wall movement, Good air movement bilaterally, CTAB RRR,No Gallops,Rubs or new Murmurs, No Parasternal Heave +ve B.Sounds, Abd Soft, Non tender, No organomegaly appriciated, No rebound -guarding or rigidity. No Cyanosis, Clubbing or edema, No new Rash or bruise   PERTINENT RADIOLOGIC STUDIES: No results found.   PERTINENT LAB RESULTS: CBC:  Recent Labs  10/09/16 0425 10/10/16 0714  WBC 7.8 8.0  HGB 14.8 15.7  HCT 44.8 47.6  PLT 271 265   CMET CMP     Component Value Date/Time   NA 138 10/09/2016 0425   K 4.0 10/09/2016 0425   CL 109 10/09/2016 0425   CO2 24 10/09/2016 0425   GLUCOSE 110 (H) 10/09/2016 0425   BUN 12  10/09/2016 0425   CREATININE 0.89 10/09/2016 0425   CALCIUM 8.7 (L) 10/09/2016 0425   GFRNONAA >60 10/09/2016 0425   GFRAA >60 10/09/2016 0425    GFR Estimated Creatinine Clearance: 185.6 mL/min (by C-G formula based on SCr of 0.89 mg/dL). No results for input(s): LIPASE, AMYLASE in the last 72 hours.  Recent Labs  10/08/16 1802 10/08/16 2150  CKTOTAL 84 79   Invalid input(s): POCBNP No results for input(s): DDIMER in the last 72 hours. No results for input(s): HGBA1C in the last 72 hours. No results for input(s): CHOL, HDL, LDLCALC, TRIG, CHOLHDL, LDLDIRECT in the last 72 hours. No results for input(s): TSH, T4TOTAL, T3FREE, THYROIDAB in the last 72 hours.  Invalid input(s): FREET3 No results for input(s): VITAMINB12, FOLATE, FERRITIN, TIBC, IRON, RETICCTPCT in the last 72 hours. Coags:  Recent Labs  10/08/16 2150 10/09/16 0425  INR 1.11 1.10   Microbiology: No results found for this or any previous visit (from the past 240 hour(s)).  FURTHER  DISCHARGE INSTRUCTIONS:  Get Medicines reviewed and adjusted: Please take all your medications with you for your next visit with your Primary MD  Laboratory/radiological data: Please request your Primary MD to go over all hospital tests and procedure/radiological results at the follow up, please ask your Primary MD to get all Hospital records sent to his/her office.  In some cases, they will be blood work, cultures and biopsy results pending at the time of your discharge. Please request that your primary care M.D. goes through all the records of your hospital data and follows up on these results.  Also Note the following: If you experience worsening of your admission symptoms, develop shortness of breath, life threatening emergency, suicidal or homicidal thoughts you must seek medical attention immediately by calling 911 or calling your MD immediately  if symptoms less severe.  You must read complete instructions/literature along with all the possible adverse reactions/side effects for all the Medicines you take and that have been prescribed to you. Take any new Medicines after you have completely understood and accpet all the possible adverse reactions/side effects.   Do not drive when taking Pain medications or sleeping medications (Benzodaizepines)  Do not take more than prescribed Pain, Sleep and Anxiety Medications. It is not advisable to combine anxiety,sleep and pain medications without talking with your primary care practitioner  Special Instructions: If you have smoked or chewed Tobacco  in the last 2 yrs please stop smoking, stop any regular Alcohol  and or any Recreational drug use.  Wear Seat belts while driving.  Please note: You were cared for by a hospitalist during your hospital stay. Once you are discharged, your primary care physician will handle any further medical issues. Please note that NO REFILLS for any discharge medications will be authorized once you are discharged,  as it is imperative that you return to your primary care physician (or establish a relationship with a primary care physician if you do not have one) for your post hospital discharge needs so that they can reassess your need for medications and monitor your lab values.  Total Time spent coordinating discharge including counseling, education and face to face time equals 25  minutes.  SignedJeoffrey Massed: Brent Arroyo 10/10/2016 10:21 AM

## 2020-07-07 ENCOUNTER — Ambulatory Visit: Payer: Self-pay | Attending: Internal Medicine

## 2020-07-07 ENCOUNTER — Other Ambulatory Visit: Payer: Self-pay

## 2020-07-07 DIAGNOSIS — Z20822 Contact with and (suspected) exposure to covid-19: Secondary | ICD-10-CM

## 2020-07-08 LAB — NOVEL CORONAVIRUS, NAA: SARS-CoV-2, NAA: NOT DETECTED

## 2020-07-08 LAB — SARS-COV-2, NAA 2 DAY TAT
